# Patient Record
Sex: Male | Born: 1995 | Race: Black or African American | Hispanic: No | Marital: Single | Smoking: Current every day smoker
Health system: Southern US, Community
[De-identification: ages and names within clinical notes are randomized; demographics above are authoritative.]

---
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
## 2015-11-21 ENCOUNTER — Emergency Department (HOSPITAL_COMMUNITY): Payer: 59

## 2015-11-21 ENCOUNTER — Encounter (HOSPITAL_COMMUNITY): Payer: Self-pay | Admitting: Oncology

## 2015-11-21 ENCOUNTER — Emergency Department (HOSPITAL_COMMUNITY)
Admission: EM | Admit: 2015-11-21 | Discharge: 2015-11-21 | Disposition: A | Discharge status: Home / Self Care | Payer: 59 | Attending: Emergency Medicine | Admitting: Emergency Medicine

## 2015-11-21 DIAGNOSIS — S01511A Laceration without foreign body of lip, initial encounter: Secondary | ICD-10-CM | POA: Insufficient documentation

## 2015-11-21 DIAGNOSIS — Y9289 Other specified places as the place of occurrence of the external cause: Secondary | ICD-10-CM | POA: Insufficient documentation

## 2015-11-21 DIAGNOSIS — Y998 Other external cause status: Secondary | ICD-10-CM | POA: Diagnosis not present

## 2015-11-21 DIAGNOSIS — S50311A Abrasion of right elbow, initial encounter: Secondary | ICD-10-CM | POA: Diagnosis not present

## 2015-11-21 DIAGNOSIS — Y9389 Activity, other specified: Secondary | ICD-10-CM | POA: Diagnosis not present

## 2015-11-21 DIAGNOSIS — S50312A Abrasion of left elbow, initial encounter: Secondary | ICD-10-CM | POA: Diagnosis not present

## 2015-11-21 DIAGNOSIS — S0990XA Unspecified injury of head, initial encounter: Secondary | ICD-10-CM | POA: Insufficient documentation

## 2015-11-21 DIAGNOSIS — T148XXA Other injury of unspecified body region, initial encounter: Secondary | ICD-10-CM

## 2015-11-21 DIAGNOSIS — T148 Other injury of unspecified body region: Secondary | ICD-10-CM | POA: Insufficient documentation

## 2015-11-21 DIAGNOSIS — S0993XA Unspecified injury of face, initial encounter: Secondary | ICD-10-CM | POA: Diagnosis present

## 2015-11-21 MED ORDER — METHOCARBAMOL 500 MG PO TABS: 500.0000 mg | ORAL_TABLET | Freq: Two times a day (BID) | ORAL | Status: DC

## 2015-11-21 MED ORDER — METHOCARBAMOL 500 MG PO TABS
500.0000 mg | ORAL_TABLET | Freq: Once | ORAL | Status: AC
  Administered 2015-11-21: 500 mg via ORAL
  Filled 2015-11-21: qty 1

## 2015-11-21 MED ORDER — OXYCODONE-ACETAMINOPHEN 5-325 MG PO TABS
1.0000 | ORAL_TABLET | Freq: Once | ORAL | Status: AC
  Administered 2015-11-21: 1 via ORAL
  Filled 2015-11-21: qty 1

## 2015-11-21 MED ORDER — LIDOCAINE-EPINEPHRINE 2 %-1:100000 IJ SOLN
20.0000 mL | Freq: Once | INTRAMUSCULAR | Status: AC
  Administered 2015-11-21: 20 mL
  Filled 2015-11-21: qty 1

## 2015-11-21 MED ORDER — BACITRACIN ZINC 500 UNIT/GM EX OINT
TOPICAL_OINTMENT | CUTANEOUS | Status: AC
  Administered 2015-11-21: 05:00:00
  Filled 2015-11-21: qty 3.6

## 2015-11-21 MED ORDER — OXYCODONE-ACETAMINOPHEN 5-325 MG PO TABS: 1.0000 | ORAL_TABLET | Freq: Four times a day (QID) | ORAL | Status: DC | PRN

## 2015-11-21 NOTE — ED Notes (Signed)
Pt in radiology 
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
## 2015-11-21 NOTE — Discharge Instructions (Signed)
Laceration Care, Adult  A laceration is a cut that goes through all layers of the skin. The cut also goes into the tissue that is right under the skin. Some cuts heal on their own. Others need to be closed with stitches (sutures), staples, skin adhesive strips, or wound glue. Taking care of your cut lowers your risk of infection and helps your cut to heal better.  HOW TO TAKE CARE OF YOUR CUT  For stitches or staples:  · Keep the wound clean and dry.  · If you were given a bandage (dressing), you should change it at least one time per day or as told by your doctor. You should also change it if it gets wet or dirty.  · Keep the wound completely dry for the first 24 hours or as told by your doctor. After that time, you may take a shower or a bath. However, make sure that the wound is not soaked in water until after the stitches or staples have been removed.  · Clean the wound one time each day or as told by your doctor:    Wash the wound with soap and water.    Rinse the wound with water until all of the soap comes off.    Pat the wound dry with a clean towel. Do not rub the wound.  · After you clean the wound, put a thin layer of antibiotic ointment on it as told by your doctor. This ointment:    Helps to prevent infection.    Keeps the bandage from sticking to the wound.  · Have your stitches or staples removed as told by your doctor.  If your doctor used skin adhesive strips:   · Keep the wound clean and dry.  · If you were given a bandage, you should change it at least one time per day or as told by your doctor. You should also change it if it gets dirty or wet.  · Do not get the skin adhesive strips wet. You can take a shower or a bath, but be careful to keep the wound dry.  · If the wound gets wet, pat it dry with a clean towel. Do not rub the wound.  · Skin adhesive strips fall off on their own. You can trim the strips as the wound heals. Do not remove any strips that are still stuck to the wound. They will  fall off after a while.  If your doctor used wound glue:  · Try to keep your wound dry, but you may briefly wet it in the shower or bath. Do not soak the wound in water, such as by swimming.  · After you take a shower or a bath, gently pat the wound dry with a clean towel. Do not rub the wound.  · Do not do any activities that will make you really sweaty until the skin glue has fallen off on its own.  · Do not apply liquid, cream, or ointment medicine to your wound while the skin glue is still on.  · If you were given a bandage, you should change it at least one time per day or as told by your doctor. You should also change it if it gets dirty or wet.  · If a bandage is placed over the wound, do not let the tape for the bandage touch the skin glue.  · Do not pick at the glue. The skin glue usually stays on for 5-10 days. Then, it   falls off of the skin.  General Instructions   · To help prevent scarring, make sure to cover your wound with sunscreen whenever you are outside after stitches are removed, after adhesive strips are removed, or when wound glue stays in place and the wound is healed. Make sure to wear a sunscreen of at least 30 SPF.  · Take over-the-counter and prescription medicines only as told by your doctor.  · If you were given antibiotic medicine or ointment, take or apply it as told by your doctor. Do not stop using the antibiotic even if your wound is getting better.  · Do not scratch or pick at the wound.  · Keep all follow-up visits as told by your doctor. This is important.  · Check your wound every day for signs of infection. Watch for:    Redness, swelling, or pain.    Fluid, blood, or pus.  · Raise (elevate) the injured area above the level of your heart while you are sitting or lying down, if possible.  GET HELP IF:  · You got a tetanus shot and you have any of these problems at the injection site:    Swelling.    Very bad pain.    Redness.    Bleeding.  · You have a fever.  · A wound that was  closed breaks open.  · You notice a bad smell coming from your wound or your bandage.  · You notice something coming out of the wound, such as wood or glass.  · Medicine does not help your pain.  · You have more redness, swelling, or pain at the site of your wound.  · You have fluid, blood, or pus coming from your wound.  · You notice a change in the color of your skin near your wound.  · You need to change the bandage often because fluid, blood, or pus is coming from the wound.  · You start to have a new rash.  · You start to have numbness around the wound.  GET HELP RIGHT AWAY IF:  · You have very bad swelling around the wound.  · Your pain suddenly gets worse and is very bad.  · You notice painful lumps near the wound or on skin that is anywhere on your body.  · You have a red streak going away from your wound.  · The wound is on your hand or foot and you cannot move a finger or toe like you usually can.  · The wound is on your hand or foot and you notice that your fingers or toes look pale or bluish.     This information is not intended to replace advice given to you by your health care provider. Make sure you discuss any questions you have with your health care provider.     Document Released: 01/09/2008 Document Revised: 12/07/2014 Document Reviewed: 07/19/2014  [employer]vier Interactive Patient Education ©2016 [employer]
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
## 2021-04-24 ENCOUNTER — Encounter (HOSPITAL_COMMUNITY): Payer: Self-pay | Admitting: Emergency Medicine

## 2021-04-24 ENCOUNTER — Emergency Department (HOSPITAL_COMMUNITY): Payer: Self-pay

## 2021-04-24 ENCOUNTER — Other Ambulatory Visit: Payer: Self-pay

## 2021-04-24 ENCOUNTER — Emergency Department (HOSPITAL_COMMUNITY)
Admission: EM | Admit: 2021-04-24 | Discharge: 2021-04-24 | Disposition: A | Discharge status: Home / Self Care | Payer: Self-pay | Attending: Emergency Medicine | Admitting: Emergency Medicine

## 2021-04-24 DIAGNOSIS — W260XXA Contact with knife, initial encounter: Secondary | ICD-10-CM | POA: Insufficient documentation

## 2021-04-24 DIAGNOSIS — Y93G9 Activity, other involving cooking and grilling: Secondary | ICD-10-CM | POA: Insufficient documentation

## 2021-04-24 DIAGNOSIS — S61212A Laceration without foreign body of right middle finger without damage to nail, initial encounter: Secondary | ICD-10-CM

## 2021-04-24 DIAGNOSIS — Z23 Encounter for immunization: Secondary | ICD-10-CM | POA: Insufficient documentation

## 2021-04-24 MED ORDER — TETANUS-DIPHTH-ACELL PERTUSSIS 5-2.5-18.5 LF-MCG/0.5 IM SUSY
0.5000 mL | PREFILLED_SYRINGE | Freq: Once | INTRAMUSCULAR | Status: AC
  Administered 2021-04-24: 0.5 mL via INTRAMUSCULAR
  Filled 2021-04-24: qty 0.5

## 2021-04-24 MED ORDER — LIDOCAINE HCL (PF) 1 % IJ SOLN
10.0000 mL | Freq: Once | INTRAMUSCULAR | Status: AC
  Administered 2021-04-24: 10 mL
  Filled 2021-04-24: qty 30

## 2021-04-24 NOTE — ED Provider Notes (Signed)
[facility]-EMERGENCY DEPT Provider Note   CSN: [id number] Arrival date & time: 04/24/21  0524     History Chief Complaint  Patient presents with   Finger Injury    Laceration      Bryce Henderson is a 25 y.o. male who presents to the emergency department with right third finger laceration which occurred late last night.  He was cutting up some food when he accidentally slipped and cut the finger.  The area is sore, no alleviating or aggravating factors.  He denies numbness, tingling, weakness, or other areas of injury.  Unknown last tetanus.  HPI     History reviewed. No pertinent past medical history.  There are no problems to display for this patient.   History reviewed. No pertinent surgical history.     No family history on file.  Social History   Tobacco Use   Smoking status: Never   Smokeless tobacco: Never  Substance Use Topics   Alcohol use: Yes    Comment: Occassional   Drug use: Yes    Types: Marijuana    Comment: occassional    Home Medications Prior to Admission medications   Medication Sig Start Date End Date Taking? Authorizing Provider  aspirin-acetaminophen-caffeine (EXCEDRIN MIGRAINE) [phone] MG tablet Take 1 tablet by mouth every 6 (six) hours as needed for headache.    [provider]  methocarbamol (ROBAXIN) 500 MG tablet Take 1 tablet (500 mg total) by mouth 2 (two) times daily. 11/21/15   Palumbo, April, MD  oxyCODONE-acetaminophen (PERCOCET) 5-325 MG tablet Take 1 tablet by mouth every 6 (six) hours as needed. 11/21/15   Palumbo, April, MD    Allergies    Patient has no known allergies.  Review of Systems   Review of Systems  Constitutional:  Negative for chills, fatigue and fever.  Gastrointestinal:  Negative for vomiting.  Musculoskeletal:  Positive for myalgias.  Skin:  Positive for wound.  Neurological:  Negative for weakness and numbness.  All other systems reviewed and are negative.  Physical  Exam Updated Vital Signs BP 135/82 (BP Location: Left Arm)   Pulse 65   Temp 98.5 F (36.9 C) (Oral)   Resp 20   SpO2 100%   Physical Exam Vitals and nursing note reviewed.  Constitutional:      General: He is not in acute distress.    Appearance: Normal appearance. He is not ill-appearing or toxic-appearing.  HENT:     Head: Normocephalic and atraumatic.  Neck:     Comments: No midline tenderness.  Cardiovascular:     Rate and Rhythm: Normal rate.     Pulses:          Radial pulses are 2+ on the right side and 2+ on the left side.  Pulmonary:     Effort: No respiratory distress.     Breath sounds: Normal breath sounds.  Musculoskeletal:     Cervical back: Normal range of motion and neck supple.     Comments: Upper extremities: Patient has an irregularly shaped laceration to the palmar aspect of the right third digit to the middle and distal phalanx.  This approximately is 4 cm total in length and at deepest area is 3 mm deep in the center with gaping.  No active bleeding.  No visible foreign bodies.  Patient has also avulsed the skin of the tip of the right third digit with an avulsion of the nail.  No active bleeding or foreign bodies to this area.  No visible nailbed laceration.  Able to fully flex/extend all MCP/IP joints.  Mildly tender directly over wounds.  Otherwise nontender.  Skin:    General: Skin is warm and dry.     Capillary Refill: Capillary refill takes less than 2 seconds.  Neurological:     Mental Status: He is alert.     Comments: Alert. Clear speech. Sensation grossly intact to bilateral upper extremities. 5/5 symmetric grip strength. Ambulatory.  Able to perform okay sign, thumbs up, and cross second/third digits bilaterally.  Psychiatric:        Mood and Affect: Mood normal.        Behavior: Behavior normal.    ED Results / Procedures / Treatments   Labs (all labs ordered are listed, but only abnormal results are displayed) Labs Reviewed - No data to  display  EKG None  Radiology DG Finger Middle Right  Result Date: 04/24/2021 CLINICAL DATA:  Finger laceration EXAM: RIGHT MIDDLE FINGER 2+V COMPARISON:  None. FINDINGS: Irregularity of the distal middle finger soft tissues. No opaque foreign body or fracture. IMPRESSION: Soft tissue injury without fracture or opaque foreign body. Electronically Signed   By: Tiburcio Pea M.D.   On: 04/24/2021 06:32    Procedures .Marland KitchenLaceration Repair  Date/Time: 04/24/2021 7:23 AM Performed by: Cherly Anderson, PA-C Authorized by: Cherly Anderson, PA-C   Consent:    Consent obtained:  Verbal   Consent given by:  Patient   Risks, benefits, and alternatives were discussed: yes     Risks discussed:  Infection, need for additional repair, nerve damage, poor wound healing, poor cosmetic result and pain   Alternatives discussed:  No treatment Anesthesia:    Anesthesia method:  Nerve block   Block location:  Right 3rd finger   Block needle gauge:  27 G   Block anesthetic:  Lidocaine 1% w/o epi   Block injection procedure:  Anatomic landmarks palpated, introduced needle, negative aspiration for blood, incremental injection and anatomic landmarks identified   Block outcome:  Anesthesia achieved Laceration details:    Location:  Finger   Finger location:  R long finger   Length (cm):  4   Depth (mm):  3 Pre-procedure details:    Preparation:  Patient was prepped and draped in usual sterile fashion and imaging obtained to evaluate for foreign bodies Exploration:    Hemostasis achieved with:  Direct pressure   Imaging obtained: x-ray     Imaging outcome: foreign body not noted     Contaminated: no   Treatment:    Area cleansed with:  Povidone-iodine   Amount of cleaning:  Standard   Irrigation solution:  Sterile water   Irrigation method:  Pressure wash Skin repair:    Repair method:  Sutures   Suture size:  4-0   Suture material:  Nylon   Number of sutures:  6 Approximation:     Approximation:  Close Repair type:    Repair type:  Simple Post-procedure details:    Procedure completion:  Tolerated well, no immediate complications   Medications Ordered in ED Medications  lidocaine (PF) (XYLOCAINE) 1 % injection 10 mL (10 mLs Infiltration Given 04/24/21 0654)  Tdap (BOOSTRIX) injection 0.5 mL (0.5 mLs Intramuscular Given 04/24/21 6503)    ED Course  I have reviewed the triage vital signs and the nursing notes.  Pertinent labs & imaging results that were available during my care of the patient were reviewed by me and considered in my medical decision making (see  chart for details).    MDM Rules/Calculators/A&P                           Patient presents to the emergency department with laceration to right 3rd finger. Patient nontoxic appearing, resting comfortably. X-ray obtained in area of laceration, no fractures/dislocations or apparent radiopaque foreign bodies. Pressure irrigation performed. Wound explored and base of wound visualized in a bloodless field without evidence of foreign body.  Neurovascularly intact distally prior to procedure.  Laceration repair per procedure note above, tolerated well. Tetanus updated at today's visit.  Remains with good flexion/extension against resistance and good cap refill following procedure. Discussed suture home care as well as need for wound recheck and suture removal in 7 days.  I discussed results, treatment plan, need for follow-up, and return precautions with the patient including signs of infection. Provided opportunity for questions, patient confirmed understanding and is in agreement with plan.     Final Clinical Impression(s) / ED Diagnoses Final diagnoses:  Laceration of right middle finger without foreign body, nail damage status unspecified, initial encounter    Rx / DC Orders ED Discharge Orders     None        Cherly Anderson, PA-C 04/24/21 0727    Molpus, Jonny Ruiz, MD 04/24/21 1049

## 2021-04-24 NOTE — ED Triage Notes (Signed)
Patient presents after cutting his finger with a knife while cooking. Bleeding controlled at this time.

## 2021-04-24 NOTE — Discharge Instructions (Addendum)
You were seen in the emergency department today for a laceration. Your laceration was closed with 6 stitches. Please keep this area clean and dry for the next 24 hours, after 24 hours you may get this area wet, but avoid soaking the area. Keep the area covered as best possible especially when in the sun to help in minimizing scarring.   Your tetanus has been updated   You will need to have the stitches removed and the wound rechecked in 7 days. Please return to the emergency department, go to an urgent care, or see your primary care provider to have this performed. Return to the ER soon should you start to experience pus type drainage from the wound, redness around the wound, or fevers as this could indicate the area is infected, please return to the ER for any other worsening symptoms or concerns that you may have.

## 2023-03-14 ENCOUNTER — Emergency Department (HOSPITAL_COMMUNITY): Payer: Self-pay

## 2023-03-14 ENCOUNTER — Observation Stay (HOSPITAL_COMMUNITY)
Admission: EM | Admit: 2023-03-14 | Discharge: 2023-03-15 | Disposition: A | Discharge status: Home / Self Care | Payer: Self-pay | Attending: Internal Medicine | Admitting: Internal Medicine

## 2023-03-14 ENCOUNTER — Observation Stay (HOSPITAL_BASED_OUTPATIENT_CLINIC_OR_DEPARTMENT_OTHER): Payer: Self-pay | Admitting: Anesthesiology

## 2023-03-14 ENCOUNTER — Encounter (HOSPITAL_COMMUNITY): Payer: Self-pay | Admitting: Internal Medicine

## 2023-03-14 ENCOUNTER — Observation Stay (HOSPITAL_COMMUNITY): Payer: Self-pay | Admitting: Anesthesiology

## 2023-03-14 ENCOUNTER — Encounter (HOSPITAL_COMMUNITY)
Admission: EM | Disposition: A | Discharge status: Home / Self Care | Payer: Self-pay | Source: Home / Self Care | Attending: Emergency Medicine

## 2023-03-14 ENCOUNTER — Other Ambulatory Visit: Payer: Self-pay

## 2023-03-14 DIAGNOSIS — Y93K9 Activity, other involving animal care: Secondary | ICD-10-CM | POA: Insufficient documentation

## 2023-03-14 DIAGNOSIS — L03114 Cellulitis of left upper limb: Secondary | ICD-10-CM | POA: Diagnosis present

## 2023-03-14 DIAGNOSIS — M65939 Unspecified synovitis and tenosynovitis, unspecified forearm: Secondary | ICD-10-CM | POA: Diagnosis present

## 2023-03-14 DIAGNOSIS — W540XXA Bitten by dog, initial encounter: Secondary | ICD-10-CM

## 2023-03-14 DIAGNOSIS — M659 Synovitis and tenosynovitis, unspecified: Secondary | ICD-10-CM | POA: Diagnosis present

## 2023-03-14 DIAGNOSIS — S51852A Open bite of left forearm, initial encounter: Secondary | ICD-10-CM

## 2023-03-14 DIAGNOSIS — D649 Anemia, unspecified: Secondary | ICD-10-CM

## 2023-03-14 DIAGNOSIS — M65132 Other infective (teno)synovitis, left wrist: Principal | ICD-10-CM

## 2023-03-14 DIAGNOSIS — S51859A Open bite of unspecified forearm, initial encounter: Secondary | ICD-10-CM

## 2023-03-14 DIAGNOSIS — L089 Local infection of the skin and subcutaneous tissue, unspecified: Secondary | ICD-10-CM | POA: Diagnosis present

## 2023-03-14 DIAGNOSIS — Z23 Encounter for immunization: Secondary | ICD-10-CM | POA: Insufficient documentation

## 2023-03-14 HISTORY — PX: I & D EXTREMITY: SHX5045

## 2023-03-14 LAB — CBC WITH DIFFERENTIAL/PLATELET
Abs Immature Granulocytes: 0.03 10*3/uL (ref 0.00–0.07)
Basophils Absolute: 0 10*3/uL (ref 0.0–0.1)
Basophils Relative: 1 %
Eosinophils Absolute: 0.1 10*3/uL (ref 0.0–0.5)
Eosinophils Relative: 1 %
HCT: 35.9 % — ABNORMAL LOW (ref 39.0–52.0)
Hemoglobin: 12 g/dL — ABNORMAL LOW (ref 13.0–17.0)
Immature Granulocytes: 0 %
Lymphocytes Relative: 19 %
Lymphs Abs: 1.6 10*3/uL (ref 0.7–4.0)
MCH: 29.5 pg (ref 26.0–34.0)
MCHC: 33.4 g/dL (ref 30.0–36.0)
MCV: 88.2 fL (ref 80.0–100.0)
Monocytes Absolute: 0.9 10*3/uL (ref 0.1–1.0)
Monocytes Relative: 11 %
Neutro Abs: 5.7 10*3/uL (ref 1.7–7.7)
Neutrophils Relative %: 68 %
Platelets: 162 10*3/uL (ref 150–400)
RBC: 4.07 MIL/uL — ABNORMAL LOW (ref 4.22–5.81)
RDW: 12.6 % (ref 11.5–15.5)
WBC: 8.4 10*3/uL (ref 4.0–10.5)
nRBC: 0 % (ref 0.0–0.2)

## 2023-03-14 LAB — BASIC METABOLIC PANEL
Anion gap: 5 (ref 5–15)
BUN: 8 mg/dL (ref 6–20)
CO2: 28 mmol/L (ref 22–32)
Calcium: 8.8 mg/dL — ABNORMAL LOW (ref 8.9–10.3)
Chloride: 104 mmol/L (ref 98–111)
Creatinine, Ser: 1.15 mg/dL (ref 0.61–1.24)
GFR, Estimated: >60 mL/min (ref >60)
Glucose, Bld: 89 mg/dL (ref 70–99)
Potassium: 3.8 mmol/L (ref 3.5–5.1)
Sodium: 137 mmol/L (ref 135–145)

## 2023-03-14 LAB — AEROBIC/ANAEROBIC CULTURE W GRAM STAIN (SURGICAL/DEEP WOUND): Gram Stain: NONE SEEN

## 2023-03-14 LAB — I-STAT CG4 LACTIC ACID, ED: Lactic Acid, Venous: 0.5 mmol/L (ref 0.5–1.9)

## 2023-03-14 LAB — HIV ANTIBODY (ROUTINE TESTING W REFLEX): HIV Screen 4th Generation wRfx: NONREACTIVE

## 2023-03-14 SURGERY — IRRIGATION AND DEBRIDEMENT EXTREMITY
Anesthesia: General | Laterality: Left

## 2023-03-14 MED ORDER — 0.9 % SODIUM CHLORIDE (POUR BTL) OPTIME
TOPICAL | Status: DC | PRN
  Administered 2023-03-14: 1000 mL

## 2023-03-14 MED ORDER — FENTANYL CITRATE (PF) 100 MCG/2ML IJ SOLN
25.0000 ug | INTRAMUSCULAR | Status: DC | PRN
  Administered 2023-03-14: 50 ug via INTRAVENOUS

## 2023-03-14 MED ORDER — MIDAZOLAM HCL 2 MG/2ML IJ SOLN
INTRAMUSCULAR | Status: DC | PRN
  Administered 2023-03-14: 2 mg via INTRAVENOUS

## 2023-03-14 MED ORDER — ACETAMINOPHEN 650 MG RE SUPP: 650.0000 mg | Freq: Four times a day (QID) | RECTAL | Status: DC | PRN

## 2023-03-14 MED ORDER — LACTATED RINGERS IV SOLN: INTRAVENOUS | Status: DC

## 2023-03-14 MED ORDER — TETANUS-DIPHTH-ACELL PERTUSSIS 5-2.5-18.5 LF-MCG/0.5 IM SUSY
0.5000 mL | PREFILLED_SYRINGE | Freq: Once | INTRAMUSCULAR | Status: AC
  Administered 2023-03-14: 0.5 mL via INTRAMUSCULAR
  Filled 2023-03-14: qty 0.5

## 2023-03-14 MED ORDER — SODIUM CHLORIDE 0.9 % IV SOLN
3.0000 g | Freq: Once | INTRAVENOUS | Status: AC
  Administered 2023-03-14: 3 g via INTRAVENOUS
  Filled 2023-03-14: qty 8

## 2023-03-14 MED ORDER — CHLORHEXIDINE GLUCONATE 4 % EX SOLN: 60.0000 mL | Freq: Once | CUTANEOUS | Status: DC

## 2023-03-14 MED ORDER — CEFAZOLIN SODIUM-DEXTROSE 2-4 GM/100ML-% IV SOLN: 2.0000 g | INTRAVENOUS | Status: DC

## 2023-03-14 MED ORDER — GADOBUTROL 1 MMOL/ML IV SOLN
7.5000 mL | Freq: Once | INTRAVENOUS | Status: AC | PRN
  Administered 2023-03-14: 7.5 mL via INTRAVENOUS

## 2023-03-14 MED ORDER — ONDANSETRON HCL 4 MG/2ML IJ SOLN: 4.0000 mg | Freq: Four times a day (QID) | INTRAMUSCULAR | Status: DC | PRN

## 2023-03-14 MED ORDER — PHENYLEPHRINE 80 MCG/ML (10ML) SYRINGE FOR IV PUSH (FOR BLOOD PRESSURE SUPPORT)
PREFILLED_SYRINGE | INTRAVENOUS | Status: DC | PRN
  Administered 2023-03-14 (×2): 80 ug via INTRAVENOUS

## 2023-03-14 MED ORDER — VITAMIN C 500 MG PO TABS
1000.0000 mg | ORAL_TABLET | Freq: Every day | ORAL | Status: DC
  Administered 2023-03-15: 1000 mg via ORAL
  Filled 2023-03-14: qty 2

## 2023-03-14 MED ORDER — SODIUM CHLORIDE 0.9 % IV SOLN
3.0000 g | Freq: Four times a day (QID) | INTRAVENOUS | Status: DC
  Administered 2023-03-15 (×2): 3 g via INTRAVENOUS
  Filled 2023-03-14 (×3): qty 8

## 2023-03-14 MED ORDER — LACTATED RINGERS IV SOLN: INTRAVENOUS | Status: DC | PRN

## 2023-03-14 MED ORDER — MORPHINE SULFATE (PF) 2 MG/ML IV SOLN
2.0000 mg | INTRAVENOUS | Status: DC | PRN
  Administered 2023-03-14: 2 mg via INTRAVENOUS
  Filled 2023-03-14: qty 1

## 2023-03-14 MED ORDER — ALBUTEROL SULFATE (2.5 MG/3ML) 0.083% IN NEBU: 2.5000 mg | INHALATION_SOLUTION | Freq: Four times a day (QID) | RESPIRATORY_TRACT | Status: DC | PRN

## 2023-03-14 MED ORDER — PROPOFOL 10 MG/ML IV BOLUS
INTRAVENOUS | Status: AC
  Filled 2023-03-14: qty 20

## 2023-03-14 MED ORDER — FENTANYL CITRATE (PF) 250 MCG/5ML IJ SOLN
INTRAMUSCULAR | Status: AC
  Filled 2023-03-14: qty 5

## 2023-03-14 MED ORDER — FENTANYL CITRATE (PF) 250 MCG/5ML IJ SOLN
INTRAMUSCULAR | Status: DC | PRN
  Administered 2023-03-14: 50 ug via INTRAVENOUS
  Administered 2023-03-14: 25 ug via INTRAVENOUS
  Administered 2023-03-14: 50 ug via INTRAVENOUS
  Administered 2023-03-14: 25 ug via INTRAVENOUS

## 2023-03-14 MED ORDER — HYDRALAZINE HCL 20 MG/ML IJ SOLN: 10.0000 mg | INTRAMUSCULAR | Status: DC | PRN

## 2023-03-14 MED ORDER — ACETAMINOPHEN 325 MG PO TABS: 650.0000 mg | ORAL_TABLET | Freq: Four times a day (QID) | ORAL | Status: DC | PRN

## 2023-03-14 MED ORDER — SODIUM CHLORIDE 0.9 % IV SOLN
INTRAVENOUS | Status: DC | PRN
  Administered 2023-03-14: 3 g via INTRAVENOUS

## 2023-03-14 MED ORDER — ACETAMINOPHEN 500 MG PO TABS
1000.0000 mg | ORAL_TABLET | Freq: Once | ORAL | Status: AC
  Administered 2023-03-14: 1000 mg via ORAL
  Filled 2023-03-14: qty 2

## 2023-03-14 MED ORDER — CHLORHEXIDINE GLUCONATE 0.12 % MT SOLN
OROMUCOSAL | Status: AC
  Administered 2023-03-14: 15 mL via OROMUCOSAL
  Filled 2023-03-14: qty 15

## 2023-03-14 MED ORDER — HYDROCODONE-ACETAMINOPHEN 5-325 MG PO TABS
1.0000 | ORAL_TABLET | Freq: Four times a day (QID) | ORAL | Status: DC | PRN
  Administered 2023-03-15 (×2): 1 via ORAL
  Filled 2023-03-14 (×2): qty 1

## 2023-03-14 MED ORDER — SODIUM CHLORIDE 0.9 % IV SOLN: 3.0000 g | Freq: Three times a day (TID) | INTRAVENOUS | Status: DC

## 2023-03-14 MED ORDER — CHLORHEXIDINE GLUCONATE 0.12 % MT SOLN: 15.0000 mL | Freq: Once | OROMUCOSAL | Status: AC

## 2023-03-14 MED ORDER — IBUPROFEN 800 MG PO TABS: 800.0000 mg | ORAL_TABLET | Freq: Once | ORAL | Status: DC

## 2023-03-14 MED ORDER — SODIUM CHLORIDE 0.9 % IR SOLN
Status: DC | PRN
  Administered 2023-03-14: 3000 mL

## 2023-03-14 MED ORDER — CEFAZOLIN SODIUM-DEXTROSE 2-4 GM/100ML-% IV SOLN
INTRAVENOUS | Status: AC
  Filled 2023-03-14: qty 100

## 2023-03-14 MED ORDER — PROPOFOL 10 MG/ML IV BOLUS
INTRAVENOUS | Status: DC | PRN
  Administered 2023-03-14: 50 mg via INTRAVENOUS
  Administered 2023-03-14: 200 mg via INTRAVENOUS

## 2023-03-14 MED ORDER — FENTANYL CITRATE (PF) 100 MCG/2ML IJ SOLN
INTRAMUSCULAR | Status: AC
  Filled 2023-03-14: qty 2

## 2023-03-14 MED ORDER — ONDANSETRON HCL 4 MG PO TABS: 4.0000 mg | ORAL_TABLET | Freq: Four times a day (QID) | ORAL | Status: DC | PRN

## 2023-03-14 MED ORDER — SODIUM CHLORIDE 0.9% FLUSH
3.0000 mL | Freq: Two times a day (BID) | INTRAVENOUS | Status: DC
  Administered 2023-03-14 (×2): 3 mL via INTRAVENOUS

## 2023-03-14 MED ORDER — BUPIVACAINE HCL (PF) 0.25 % IJ SOLN
INTRAMUSCULAR | Status: AC
  Filled 2023-03-14: qty 30

## 2023-03-14 MED ORDER — POVIDONE-IODINE 10 % EX SWAB: 2.0000 | Freq: Once | CUTANEOUS | Status: DC

## 2023-03-14 MED ORDER — DEXAMETHASONE SODIUM PHOSPHATE 10 MG/ML IJ SOLN
INTRAMUSCULAR | Status: DC | PRN
  Administered 2023-03-14: 10 mg via INTRAVENOUS

## 2023-03-14 MED ORDER — ENOXAPARIN SODIUM 40 MG/0.4ML IJ SOSY
40.0000 mg | PREFILLED_SYRINGE | INTRAMUSCULAR | Status: DC
  Filled 2023-03-14: qty 0.4

## 2023-03-14 MED ORDER — AMOXICILLIN-POT CLAVULANATE 875-125 MG PO TABS
1.0000 | ORAL_TABLET | Freq: Once | ORAL | Status: AC
  Administered 2023-03-14: 1 via ORAL
  Filled 2023-03-14: qty 1

## 2023-03-14 MED ORDER — MIDAZOLAM HCL 2 MG/2ML IJ SOLN
INTRAMUSCULAR | Status: AC
  Filled 2023-03-14: qty 2

## 2023-03-14 MED ORDER — ONDANSETRON HCL 4 MG/2ML IJ SOLN
INTRAMUSCULAR | Status: DC | PRN
  Administered 2023-03-14: 4 mg via INTRAVENOUS

## 2023-03-14 MED ORDER — BUPIVACAINE HCL (PF) 0.25 % IJ SOLN
INTRAMUSCULAR | Status: DC | PRN
  Administered 2023-03-14: 10 mL

## 2023-03-14 MED ORDER — ORAL CARE MOUTH RINSE: 15.0000 mL | Freq: Once | OROMUCOSAL | Status: AC

## 2023-03-14 MED ORDER — LIDOCAINE 2% (20 MG/ML) 5 ML SYRINGE
INTRAMUSCULAR | Status: DC | PRN
  Administered 2023-03-14: 40 mg via INTRAVENOUS

## 2023-03-14 SURGICAL SUPPLY — 40 items
BAG COUNTER SPONGE SURGICOUNT (BAG) ×2 IMPLANT
BAG SPNG CNTER NS LX DISP (BAG) ×1
BNDG CMPR 5X3 KNIT ELC UNQ LF (GAUZE/BANDAGES/DRESSINGS) ×1
BNDG CMPR 9X4 STRL LF SNTH (GAUZE/BANDAGES/DRESSINGS) ×1
BNDG ELASTIC 3INX 5YD STR LF (GAUZE/BANDAGES/DRESSINGS) IMPLANT
BNDG ESMARK 4X9 LF (GAUZE/BANDAGES/DRESSINGS) IMPLANT
BNDG GAUZE DERMACEA FLUFF 4 (GAUZE/BANDAGES/DRESSINGS) IMPLANT
BNDG GZE DERMACEA 4 6PLY (GAUZE/BANDAGES/DRESSINGS) ×1
CORD BIPOLAR FORCEPS 12FT (ELECTRODE) ×2 IMPLANT
COVER SURGICAL LIGHT HANDLE (MISCELLANEOUS) ×2 IMPLANT
CUFF TOURN SGL QUICK 18X4 (TOURNIQUET CUFF) IMPLANT
GAUZE PACKING IODOFORM 1/4X15 (PACKING) IMPLANT
GAUZE SPONGE 4X4 12PLY STRL (GAUZE/BANDAGES/DRESSINGS) IMPLANT
GLOVE BIO SURGEON STRL SZ7.5 (GLOVE) ×2 IMPLANT
GLOVE BIOGEL PI IND STRL 8 (GLOVE) ×2 IMPLANT
GOWN STRL REUS W/ TWL LRG LVL3 (GOWN DISPOSABLE) ×2 IMPLANT
GOWN STRL REUS W/ TWL XL LVL3 (GOWN DISPOSABLE) ×2 IMPLANT
GOWN STRL REUS W/TWL LRG LVL3 (GOWN DISPOSABLE) ×1
GOWN STRL REUS W/TWL XL LVL3 (GOWN DISPOSABLE) ×1
KIT BASIN OR (CUSTOM PROCEDURE TRAY) ×2 IMPLANT
KIT TURNOVER KIT B (KITS) ×2 IMPLANT
MANIFOLD NEPTUNE II (INSTRUMENTS) IMPLANT
NDL HYPO 25GX1X1/2 BEV (NEEDLE) IMPLANT
NEEDLE HYPO 25GX1X1/2 BEV (NEEDLE) ×1 IMPLANT
NS IRRIG 1000ML POUR BTL (IV SOLUTION) ×2 IMPLANT
PACK ORTHO EXTREMITY (CUSTOM PROCEDURE TRAY) ×2 IMPLANT
PAD ARMBOARD 7.5X6 YLW CONV (MISCELLANEOUS) ×4 IMPLANT
PAD CAST 3X4 CTTN HI CHSV (CAST SUPPLIES) IMPLANT
PADDING CAST COTTON 3X4 STRL (CAST SUPPLIES) ×1
SET CYSTO W/LG BORE CLAMP LF (SET/KITS/TRAYS/PACK) IMPLANT
SOL PREP POV-IOD 4OZ 10% (MISCELLANEOUS) ×4 IMPLANT
SPLINT PLASTER CAST FAST 3X15 (CAST SUPPLIES) IMPLANT
SWAB COLLECTION DEVICE MRSA (MISCELLANEOUS) IMPLANT
SWAB CULTURE ESWAB REG 1ML (MISCELLANEOUS) IMPLANT
SYR 20ML LL LF (SYRINGE) ×2 IMPLANT
SYR CONTROL 10ML LL (SYRINGE) IMPLANT
TOWEL GREEN STERILE (TOWEL DISPOSABLE) ×2 IMPLANT
TUBE CONNECTING 12X1/4 (SUCTIONS) ×2 IMPLANT
UNDERPAD 30X36 HEAVY ABSORB (UNDERPADS AND DIAPERS) ×2 IMPLANT
YANKAUER SUCT BULB TIP NO VENT (SUCTIONS) ×2 IMPLANT

## 2023-03-14 NOTE — Progress Notes (Signed)
Post op note: S/p I&D left forearm dog bite wounds.  Purulence in each wound.  Wounds packed.  Continue antibiotics.  Start hydrotherapy in 2-4 days.  Okay for d/c home when WBC normalizing, afebrile, pain controlled.  Can start hydrotherapy in office next week.

## 2023-03-14 NOTE — ED Notes (Signed)
Patient transported to MRI 

## 2023-03-14 NOTE — Op Note (Signed)
NAME: Bryce Henderson MEDICAL RECORD NO: [id number] DATE OF BIRTH: [birth year] FACILITY: [facility] LOCATION: [facility] OR PHYSICIAN: Tami Ribas, MD   OPERATIVE REPORT   DATE OF PROCEDURE: 03/14/23    PREOPERATIVE DIAGNOSIS: Left forearm infected dog bite wounds x 4   POSTOPERATIVE DIAGNOSIS: Left forearm infected dog bite wounds x 4   PROCEDURE: Incision and drainage left forearm infected dog bite wounds x 4   SURGEON:  Betha Loa, M.D.   ASSISTANT: none   ANESTHESIA:  General   INTRAVENOUS FLUIDS:  Per anesthesia flow sheet.   ESTIMATED BLOOD LOSS:  Minimal.   COMPLICATIONS:  None.   SPECIMENS: Cultures to micro   TOURNIQUET TIME:    Total Tourniquet Time Documented: Upper Arm (Left) - 40 minutes Total: Upper Arm (Left) - 40 minutes    DISPOSITION:  Stable to PACU.   INDICATIONS: 27 year old male states he was bitten on the left forearm by his dog 3 days ago.  Has had progressively worsening swelling and pain in the forearm since.  He presented to the emergency department.  I recommended incision and drainage in the operating room.  Risks, benefits and alternatives of surgery were discussed including the risks of blood loss, infection, damage to nerves, vessels, tendons, ligaments, bone for surgery, need for additional surgery, complications with wound healing, continued pain, stiffness, , need for repeat irrigation and debridement.  He voiced understanding of these risks and elected to proceed.  OPERATIVE COURSE:  After being identified preoperatively by myself,  the patient and I agreed on the procedure and site of the procedure.  The surgical site was marked.  Surgical consent had been signed. Antibiotics were held for intraoperative cultures. He was transferred to the operating room and placed on the operating table in supine position with the Left upper extremity on an arm board.  General anesthesia was induced by the anesthesiologist.  Left upper extremity was prepped and  draped in normal sterile orthopedic fashion.  A surgical pause was performed between the surgeons, anesthesia, and operating room staff and all were in agreement as to the patient, procedure, and site of procedure.  Tourniquet at the proximal aspect of the extremity was inflated to 250 mmHg after exsanguination of the arm with an Esmarch bandage.  The wounds were open.  There is gross purulence in all 4 wounds.  Cultures were taken for aerobes and anaerobes.  There were 2 wounds at the volar radial aspect of the distal forearm.  These were both extended proximally and distally.  The knife was used to debride the skin and subcutaneous tissues to remove devitalized tissues.  The wounds went deep to the fascia.  The distal wound went down into the periosteum.  The proximal wound went down to muscle.  On the dorsum of the forearm the more proximal wound was smaller.  This was extended radially and ulnarly.  The wound went down into the muscle.  There was small bout of gross purulence.  The distal dorsal wound was the largest.  There was significant devitalized tissue.  There was purulence.  The wound went down into the second and third dorsal compartments.  There is a small hole in the fourth dorsal compartment.  He should these compartments was opened.  There is no gross purulence within the compartments.  There was synovial type fluid.  The wound was sharply debrided including skin subcutaneous tissues and fascia using the scissors and knife.  The rondure was used to remove infected and devitalized  tissues.  Hemostasis was obtained in all wounds using the bipolar electrocautery.  All wounds were copiously irrigated with sterile saline by cystoscopy tubing.  The wounds were then packed with quarter and try to form gauze.  They were injected with core percent plain Marcaine to aid in postoperative analgesia.  They were dressed with sterile 4 x 4's and wrapped with a Kerlix bandage.  Volar splint was placed and wrapped  with Kerlix and Ace bandage.  The tourniquet was deflated at 40 minutes.  Fingertips were pink with brisk capillary refill after deflation of tourniquet.  The operative  drapes were broken down.  The patient was awoken from anesthesia safely.  He was transferred back to the stretcher and taken to PACU in stable condition.  He is admitted to the hospitalist for IV antibiotics.  Will start hydrotherapy in 2 to 4 days.   Betha Loa, MD Electronically signed, 03/14/23

## 2023-03-14 NOTE — Transfer of Care (Signed)
Immediate Anesthesia Transfer of Care Note  Patient: Bryce Henderson  Procedure(s) Performed: IRRIGATION AND DEBRIDEMENT LEFT UPPER ARM (Left)  Patient Location: PACU  Anesthesia Type:General  Level of Consciousness: awake and alert   Airway & Oxygen Therapy: Patient Spontanous Breathing  Post-op Assessment: Report given to RN and Post -op Vital signs reviewed and stable  Post vital signs: Reviewed and stable  Last Vitals:  Vitals Value Taken Time  BP 134/89 03/14/23 2046  Temp 36.4 C 03/14/23 2045  Pulse 58 03/14/23 2048  Resp 18 03/14/23 2048  SpO2 100 % 03/14/23 2048  Vitals shown include unfiled device data.  Last Pain:  Vitals:   03/14/23 1824  TempSrc: Oral  PainSc:          Complications: No notable events documented.

## 2023-03-14 NOTE — Anesthesia Preprocedure Evaluation (Addendum)
Anesthesia Evaluation  Patient identified by MRN, date of birth, ID band Patient awake    Reviewed: Allergy & Precautions, NPO status , Patient's Chart, lab work & pertinent test results  History of Anesthesia Complications Negative for: history of anesthetic complications  Airway Mallampati: II  TM Distance: >3 FB Neck ROM: Full    Dental  (+) Teeth Intact, Dental Advisory Given   Pulmonary Current Smoker and Patient abstained from smoking.   breath sounds clear to auscultation       Cardiovascular negative cardio ROS  Rhythm:Regular     Neuro/Psych negative neurological ROS  negative psych ROS   GI/Hepatic negative GI ROS,,,(+)     substance abuse  marijuana use  Endo/Other  negative endocrine ROS    Renal/GU negative Renal ROS     Musculoskeletal negative musculoskeletal ROS (+)    Abdominal   Peds  Hematology negative hematology ROS (+)   Anesthesia Other Findings Dog bite  Reproductive/Obstetrics                             Anesthesia Physical Anesthesia Plan  ASA: 2  Anesthesia Plan: General   Post-op Pain Management: Tylenol PO (pre-op)*   Induction: Intravenous  PONV Risk Score and Plan: 1 and Ondansetron, Dexamethasone and Midazolam  Airway Management Planned: LMA  Additional Equipment: None  Intra-op Plan:   Post-operative Plan: Extubation in OR  Informed Consent: I have reviewed the patients History and Physical, chart, labs and discussed the procedure including the risks, benefits and alternatives for the proposed anesthesia with the patient or authorized representative who has indicated his/her understanding and acceptance.     Dental advisory given  Plan Discussed with: CRNA  Anesthesia Plan Comments:        Anesthesia Quick Evaluation

## 2023-03-14 NOTE — ED Triage Notes (Addendum)
Pt. Stated, I was doing house work and cut my left wrist on top. Pt. Has multiple[ cuts to the left wrist area and did not want to explain anything about it. Left hand and wrist swollen and warm to the touch. This took place on Monday

## 2023-03-14 NOTE — Discharge Instructions (Signed)

## 2023-03-14 NOTE — Anesthesia Procedure Notes (Addendum)
Procedure Name: LMA Insertion Date/Time: 03/14/2023 7:17 PM  Performed by: Edmonia Caprio, CRNAPre-anesthesia Checklist: Patient identified, Emergency Drugs available, Suction available, Timeout performed and Patient being monitored Patient Re-evaluated:Patient Re-evaluated prior to induction Oxygen Delivery Method: Circle system utilized Preoxygenation: Pre-oxygenation with 100% oxygen Induction Type: IV induction Ventilation: Mask ventilation without difficulty LMA: LMA inserted LMA Size: 5.0 Tube type: Oral Number of attempts: 2 Placement Confirmation: positive ETCO2 and breath sounds checked- equal and bilateral Tube secured with: Tape Dental Injury: Teeth and Oropharynx as per pre-operative assessment

## 2023-03-14 NOTE — H&P (Signed)
History and Physical    Patient: Bryce Henderson WUJ:[id number] DOB: 1995-12-06 DOA: 03/14/2023 DOS: the patient was seen and examined on 03/14/2023 PCP: Maryellen Pile, MD (Inactive)  Patient coming from: Home  Chief Complaint:  Chief Complaint  Patient presents with   Laceration   LEFT WRIST PAIN   HPI: CARLY Henderson is a 27 y.o. male without significant medical history who presents with complaints of swollen left wrist.  He had gotten bit by his dog on his wrist 3 days ago.  He reports his dog is up-to-date on all of his vaccinations.  At home patient had cleaned the wound with hydrogen peroxide and had been putting Neosporin on it.  Despite doing so reported increasing pain and swelling in the affected area.  He had been taking Tylenol for pain.  He denied having any fever, chills, cough, shortness of breath, nausea, vomiting, or abdominal pain.  He had gone to [facility] urgent care today due to due to his symptoms that was found to have a fever up to 101 F and having given 800 mg of ibuprofen prior to being sent to the hospital for further evaluation.  In the emergency department patient was noted to be afebrile with stable vital signs.  Labs relatively unremarkable with white blood cell count 8.4, lactic acid 0.5, and hemoglobin 12.  X-rays of the left wrist noted soft tissue swelling with subcutaneous emphysema along the dorsal and radial aspect of the wrist and distal forearm.  MRI left hand and forearm significant for severe soft tissue swelling of the wrist and hand consistent with cellulitis and mild second and third extensor compartment tenosynovitis.  Dr. Merlyn Lot pain and surgery have been consulted and plan to take the patient for I&D.  Patient had been given Augmentin 1 tablet p.o. and a tetanus booster prior to being started on Unasyn.   Review of Systems: As mentioned in the history of present illness. All other systems reviewed and are negative.  No past medical history on file. No  past surgical history on file. Social History:  reports that he has been smoking cigarettes. He has never used smokeless tobacco. He reports current alcohol use. He reports current drug use. Drug: Marijuana.  No Known Allergies  No family history on file.  Prior to Admission medications   Medication Sig Start Date End Date Taking? Authorizing Provider  acetaminophen (TYLENOL) 500 MG tablet Take 1,000 mg by mouth 2 (two) times daily as needed for moderate pain, fever or headache.   Yes [provider]  ibuprofen (ADVIL) 200 MG tablet Take 800 mg by mouth 2 (two) times daily as needed for headache or moderate pain.   Yes [provider]    Physical Exam: Vitals:   03/14/23 0826 03/14/23 0830 03/14/23 0900 03/14/23 1153  BP: 124/73 130/83 117/81 114/73  Pulse: 82 65 69 (!) 56  Resp: 18   18  Temp: 98.3 F (36.8 C)   97.9 F (36.6 C)  TempSrc: Oral   Oral  SpO2: 99% 98% 99% 100%  Weight: 75.8 kg     Height: 6' (1.829 m)       Constitutional: Young male currently no acute distress Eyes: PERRL, lids and conjunctivae normal ENMT: Mucous membranes are moist. Posterior pharynx clear of any exudate or lesions.Normal dentition.  Neck: normal, supple, no masses, no thyromegaly Respiratory: clear to auscultation bilaterally, no wheezing, no crackles. Normal respiratory effort. No accessory muscle use.  Cardiovascular: Regular rate and rhythm, no  murmurs / rubs / gallops. No extremity edema. 2+ pedal pulses. No carotid bruits.  Abdomen: no tenderness, no masses palpated. No hepatosplenomegaly. Bowel sounds positive.  Musculoskeletal: no clubbing / cyanosis. No joint deformity upper and lower extremities. Good ROM, no contractures.  Skin: Swelling of the left wrist with lacerations and bite marks as seen below    Neurologic: CN 2-12 grossly intact.   Strength 5/5 in all 4.  Psychiatric: Normal judgment and insight. Alert and oriented x 3. Normal mood.   Data  Reviewed:  Reviewed labs, imaging, and pertinent records note above.  Assessment and Plan: Infected dog bite of forearm Cellulitis and tenosynovitis of left forearm Acute.  Patient reports getting bitten on his left wrist by his dog 3 days ago.  Seen at urgent care to have fever up to 101 F but no other SIRS criteria met MRI of the left wrist and forearm significant for cellulitis with concern for mild tenosynovitis.  Dr. Merlyn Lot of hand surgery have been consulted and plan to take the patient for I&D.  Patient had been given Augmentin and subsequently switched to Unasyn IV. -Admit to a MedSurg bed -N.p.o. for I&D.  Orders placed to advance diet following procedure -Continue Unasyn IV -Hydrocodone/morphine IV as needed for moderate-severe pain -Appreciate hand surgery consultative services we will follow-up for any further recommendation  Normocytic anemia Acute.  Hemoglobin 12.  No reports of bleeding.  No prior labs to compare. -Recommend patient follow-up in the outpatient setting  DVT prophylaxis: Lovenox Advance Care Planning:   Code Status: Full Code    Consults: Hand surgery  Family Communication: Significant other updated at bedside  Severity of Illness: The appropriate patient status for this patient is OBSERVATION. Observation status is judged to be reasonable and necessary in order to provide the required intensity of service to ensure the patient's safety. The patient's presenting symptoms, physical exam findings, and initial radiographic and laboratory data in the context of their medical condition is felt to place them at decreased risk for further clinical deterioration. Furthermore, it is anticipated that the patient will be medically stable for discharge from the hospital within 2 midnights of admission.   Author: Clydie Braun, MD 03/14/2023 1:39 PM  For on call review [email or web address].

## 2023-03-14 NOTE — Consult Note (Addendum)
Reason for Consult:Left arm infection Referring Physician: Alvester Chou Time called: [id number] Time at bedside: 0927   Bryce Henderson is an 27 y.o. male.  HPI: Bryce Henderson was bitten by his dog on Monday. Dog is up to date on shots. He has been treating this at home with H2O2 and Neosporin. He notes that it hasn't really gotten better or worse to date. He came to the ED for evaluation and hand surgery was consulted. He is R[employer] and works as a [employer].  No past medical history on file.  No past surgical history on file.  No family history on file.  Social History:  reports that he has never smoked. He has never used smokeless tobacco. He reports current alcohol use. He reports current drug use. Drug: Marijuana.  Allergies: No Known Allergies  Medications: I have reviewed the patient's current medications.  Results for orders placed or performed during the hospital encounter of 03/14/23 (from the past 48 hour(s))  I-Stat CG4 Lactic Acid     Status: None   Collection Time: 03/14/23  9:34 AM  Result Value Ref Range   Lactic Acid, Venous 0.5 0.5 - 1.9 mmol/L    DG Wrist Complete Left  Result Date: 03/14/2023 CLINICAL DATA:  Laceration to the left wrist EXAM: LEFT WRIST - COMPLETE 4 VIEW COMPARISON:  Left wrist radiographs dated 11/21/2015 FINDINGS: There is no evidence of fracture or dislocation. There is no evidence of arthropathy or other focal bone abnormality. Soft tissue swelling with foci of subcutaneous emphysema along the dorsal and radial aspect of the wrist and distal forearm. No radiopaque foreign body. IMPRESSION: 1. Soft tissue swelling with foci of subcutaneous emphysema along the dorsal and radial aspect of the wrist and distal forearm. No radiopaque foreign body. 2. No acute fracture or dislocation. Electronically Signed   By: Agustin Cree M.D.   On: 03/14/2023 08:50    Review of Systems  Constitutional:  Negative for chills, diaphoresis and fever.  HENT:  Negative for ear  discharge, ear pain, hearing loss and tinnitus.   Eyes:  Negative for photophobia and pain.  Respiratory:  Negative for cough and shortness of breath.   Cardiovascular:  Negative for chest pain.  Gastrointestinal:  Negative for abdominal pain, nausea and vomiting.  Genitourinary:  Negative for dysuria, flank pain, frequency and urgency.  Musculoskeletal:  Positive for arthralgias (Left FA/hand). Negative for back pain, myalgias and neck pain.  Neurological:  Negative for dizziness and headaches.  Hematological:  Does not bruise/bleed easily.  Psychiatric/Behavioral:  The patient is not nervous/anxious.    Blood pressure 124/73, pulse 82, temperature 98.3 F (36.8 C), temperature source Oral, resp. rate 18, height 6' (1.829 m), weight 75.8 kg, SpO2 99%. Physical Exam Constitutional:      General: He is not in acute distress.    Appearance: He is well-developed. He is not diaphoretic.  HENT:     Head: Normocephalic and atraumatic.  Eyes:     General: No scleral icterus.       Right eye: No discharge.        Left eye: No discharge.     Conjunctiva/sclera: Conjunctivae normal.  Cardiovascular:     Rate and Rhythm: Normal rate and regular rhythm.  Pulmonary:     Effort: Pulmonary effort is normal. No respiratory distress.  Musculoskeletal:     Cervical back: Normal range of motion.     Comments: Left shoulder, elbow, wrist, digits- Laceration to radial distal FA, transverse around 3cm  with some purulent elements noted in wound bed, some puncture wounds volar FA, hand and distal FA with edema but little erythema, mod TTP but able to AROM wrist and he will let me extend fingers though notes it hurts, no instability, no blocks to motion  Sens  Ax/R/M/U intact  Mot   Ax/ R/ PIN/ M/ AIN/ U intact  Rad 2+  Skin:    General: Skin is warm and dry.  Neurological:     Mental Status: He is alert.  Psychiatric:        Mood and Affect: Mood normal.        Behavior: Behavior normal.     Assessment/Plan: Left arm infection -- Plan I&D this afternoon by Dr. Merlyn Lot. Request medical admit for IV abx administration.    Bryce Caldron, PA-C Orthopedic Surgery [phone] 03/14/2023, 9:37 AM   Addendum (03/14/2023): Patient seen and examined.  Agree with above. History: 27 year old male states he was bitten by his dog on his left arm 3 days ago.  Has had progressively worsening swelling and pain since that day.  No subjective fevers.  He feels his hand has improved a little bit since antibiotics this morning. Exam: Intact sensation and cap refill all fingertips.  He has a wound on the dorsum of the wrist over the fourth dorsal compartment with active drainage.  Multiple smaller wounds on the volar radial aspect of the distal forearm.  Small amount of erythema surrounding knees.  He is tender to palpation around the wounds.  He is able to flex and extend all digits.  Swelling of the dorsum of the hand with tenderness to palpation.  No erythema no warmth. Assessment/plan: Left forearm/wrist infected dog bite wounds.  Recommend incision and drainage in the operating room.  Risks, benefits and alternatives of surgery were discussed including risks of blood loss, infection, damage to nerves/vessels/tendons/ligament/bone, failure of surgery, need for additional surgery, complication with wound healing, stiffness, need for repeat irrigation and debridement.  He voiced understanding of these risks and elected to proceed.

## 2023-03-14 NOTE — ED Notes (Signed)
Pt returned from MRI °

## 2023-03-14 NOTE — ED Notes (Signed)
MD at bedside. 

## 2023-03-14 NOTE — ED Notes (Signed)
ED TO INPATIENT HANDOFF REPORT  ED Nurse Name and Phone #: Lenell Antu Name/Age/Gender Bryce Henderson 27 y.o. male Room/Bed: 043C/043C  Code Status   Code Status: Full Code  Home/SNF/Other Home Patient oriented to: self, place, time, and situation Is this baseline? Yes   Triage Complete: Triage complete  Chief Complaint Infected dog bite of forearm, initial encounter [S51.859A, L08.9, W54.0XXA]  Triage Note Pt. Stated, I was doing house work and cut my left wrist on top. Pt. Has multiple[ cuts to the left wrist area and did not want to explain anything about it. Left hand and wrist swollen and warm to the touch. This took place on Monday   Allergies No Known Allergies  Level of Care/Admitting Diagnosis ED Disposition     ED Disposition  Admit   Condition  --   Comment  Hospital Area: [facility] [[id number]]  Level of Care: Med-Surg [16]  May place patient in observation at [facility] or [facility] if equivalent level of care is available:: No  Covid Evaluation: Asymptomatic - no recent exposure (last 10 days) testing not required  Diagnosis: Infected dog bite of forearm, initial encounter [[id number]]  Admitting Physician: Clydie Braun [[id number]]  Attending Physician: Clydie Braun [[id number]]          B Medical/Surgery History No past medical history on file. No past surgical history on file.   A IV Location/Drains/Wounds Patient Lines/Drains/Airways Status     Active Line/Drains/Airways     Name Placement date Placement time Site Days   Peripheral IV 03/14/23 20 G Right Antecubital 03/14/23  1315  Antecubital  less than 1            Intake/Output Last 24 hours No intake or output data in the 24 hours ending 03/14/23 1411  Labs/Imaging Results for orders placed or performed during the hospital encounter of 03/14/23 (from the past 48 hour(s))  CBC with Differential     Status: Abnormal   Collection Time: 03/14/23  9:29 AM  Result  Value Ref Range   WBC 8.4 4.0 - 10.5 K/uL   RBC 4.07 (L) 4.22 - 5.81 MIL/uL   Hemoglobin 12.0 (L) 13.0 - 17.0 g/dL   HCT 38.7 (L) 56.4 - 33.2 %   MCV 88.2 80.0 - 100.0 fL   MCH 29.5 26.0 - 34.0 pg   MCHC 33.4 30.0 - 36.0 g/dL   RDW 95.1 88.4 - 16.6 %   Platelets 162 150 - 400 K/uL   nRBC 0.0 0.0 - 0.2 %   Neutrophils Relative % 68 %   Neutro Abs 5.7 1.7 - 7.7 K/uL   Lymphocytes Relative 19 %   Lymphs Abs 1.6 0.7 - 4.0 K/uL   Monocytes Relative 11 %   Monocytes Absolute 0.9 0.1 - 1.0 K/uL   Eosinophils Relative 1 %   Eosinophils Absolute 0.1 0.0 - 0.5 K/uL   Basophils Relative 1 %   Basophils Absolute 0.0 0.0 - 0.1 K/uL   Immature Granulocytes 0 %   Abs Immature Granulocytes 0.03 0.00 - 0.07 K/uL    Comment: Performed at [facility], [address], [place], [place] [address]  Basic metabolic panel     Status: Abnormal   Collection Time: 03/14/23  9:29 AM  Result Value Ref Range   Sodium 137 135 - 145 mmol/L   Potassium 3.8 3.5 - 5.1 mmol/L   Chloride 104 98 - 111 mmol/L   CO2 28 22 -  32 mmol/L   Glucose, Bld 89 70 - 99 mg/dL    Comment: Glucose reference range applies only to samples taken after fasting for at least 8 hours.   BUN 8 6 - 20 mg/dL   Creatinine, Ser 4.54 0.61 - 1.24 mg/dL   Calcium 8.8 (L) 8.9 - 10.3 mg/dL   GFR, Estimated >09 >81 mL/min    Comment: (NOTE) Calculated using the CKD-EPI Creatinine Equation (2021)    Anion gap 5 5 - 15    Comment: Performed at [facility], [address], [place], [place] [address]  I-Stat CG4 Lactic Acid     Status: None   Collection Time: 03/14/23  9:34 AM  Result Value Ref Range   Lactic Acid, Venous 0.5 0.5 - 1.9 mmol/L   MR FOREARM LEFT W WO CONTRAST  Result Date: 03/14/2023 CLINICAL DATA:  Bitten by his dog on Monday. EXAM: MRI OF THE LEFT FOREARM WITHOUT AND WITH CONTRAST TECHNIQUE: Multiplanar, multisequence MR imaging of the left forearm was performed before and after the administration of intravenous  contrast. CONTRAST:  7.66mL GADAVIST GADOBUTROL 1 MMOL/ML IV SOLN COMPARISON:  None Available. FINDINGS: Bones/Joint/Cartilage No marrow signal abnormality. No fracture or dislocation. Joint spaces are preserved. No joint effusion. Muscles and Tendons Flexor and extensor tendons are intact. Small amount of fluid in the second and third extensor compartment tendon sheaths with associated synovial enhancement. No muscle edema or atrophy. Soft tissue Severe diffuse soft tissue swelling with enhancement. No discrete fluid collection. Soft tissue irregularity along the dorsal and radial aspects of the distal radius, presumably puncture wounds from dog bite. Mild inflammatory change in the pronator fat pad. No soft tissue mass. IMPRESSION: 1. Severe cellulitis of the forearm. No abscess or osteomyelitis. 2. Mild second and third extensor compartment tenosynovitis. 3. Soft tissue irregularity along the dorsal and radial aspects of the distal radius, presumably puncture wounds from dog bite. Electronically Signed   By: Obie Dredge M.D.   On: 03/14/2023 13:01   MR HAND LEFT W WO CONTRAST  Result Date: 03/14/2023 CLINICAL DATA:  Dog bite on Monday. EXAM: MRI OF THE LEFT HAND WITHOUT AND WITH CONTRAST TECHNIQUE: Multiplanar, multisequence MR imaging of the left hand was performed before and after the administration of intravenous contrast. CONTRAST:  7.74mL GADAVIST GADOBUTROL 1 MMOL/ML IV SOLN COMPARISON:  Left wrist x-rays from same day. FINDINGS: Bones/Joint/Cartilage No marrow signal abnormality. No fracture or dislocation. Joint spaces are preserved. No joint effusion. Ligaments Collateral ligaments are intact. Muscles and Tendons Flexor and extensor tendons are intact. Small amount of fluid in the second and third extensor tendon compartments with associated synovial enhancement. Soft tissue Severe soft tissue swelling of the wrist and hand with patchy enhancement. No fluid collection. Soft tissue irregularity at  the level of the dorsal distal radius, presumably site of dog bite. No soft tissue mass. IMPRESSION: 1. Severe soft tissue swelling of the wrist and hand, consistent with cellulitis. No abscess or osteomyelitis. 2. Mild second and third extensor compartment tenosynovitis. Electronically Signed   By: Obie Dredge M.D.   On: 03/14/2023 12:53   DG Wrist Complete Left  Result Date: 03/14/2023 CLINICAL DATA:  Laceration to the left wrist EXAM: LEFT WRIST - COMPLETE 4 VIEW COMPARISON:  Left wrist radiographs dated 11/21/2015 FINDINGS: There is no evidence of fracture or dislocation. There is no evidence of arthropathy or other focal bone abnormality. Soft tissue swelling with foci of subcutaneous emphysema along the dorsal and radial aspect  of the wrist and distal forearm. No radiopaque foreign body. IMPRESSION: 1. Soft tissue swelling with foci of subcutaneous emphysema along the dorsal and radial aspect of the wrist and distal forearm. No radiopaque foreign body. 2. No acute fracture or dislocation. Electronically Signed   By: Agustin Cree M.D.   On: 03/14/2023 08:50    Pending Labs [employer] (From admission, onward)     Start     Ordered   Signed and Held  HIV Antibody (routine testing w rflx)  (HIV Antibody (Routine testing w reflex) panel)  Once,   R        Signed and Held   Signed and Held  CBC  Tomorrow morning,   R        Signed and Held   Signed and Held  Basic metabolic panel  Tomorrow morning,   R        Signed and Held            Vitals/Pain Today's Vitals   03/14/23 1230 03/14/23 1300 03/14/23 1330 03/14/23 1411  BP: (!) 132/90 131/81 112/85   Pulse: 71 (!) 56 (!) 54   Resp:   16   Temp:    97.9 F (36.6 C)  TempSrc:    Oral  SpO2: 100% 100% 100%   Weight:      Height:        Isolation Precautions No active isolations  Medications Medications  Ampicillin-Sulbactam (UNASYN) 3 g in sodium chloride 0.9 % 100 mL IVPB (has no administration in time range)  Tdap  (BOOSTRIX) injection 0.5 mL (0.5 mLs Intramuscular Given 03/14/23 0843)  amoxicillin-clavulanate (AUGMENTIN) 875-125 MG per tablet 1 tablet (1 tablet Oral Given 03/14/23 0843)  Ampicillin-Sulbactam (UNASYN) 3 g in sodium chloride 0.9 % 100 mL IVPB (0 g Intravenous Stopped 03/14/23 1356)  gadobutrol (GADAVIST) 1 MMOL/ML injection 7.5 mL (7.5 mLs Intravenous Contrast Given 03/14/23 1227)    Mobility walks     Focused Assessments     R Recommendations: See Admitting Provider Note  Report given to:   Additional Notes:

## 2023-03-14 NOTE — ED Notes (Signed)
Pt ambulated to restroom. 

## 2023-03-14 NOTE — ED Provider Notes (Signed)
[place] EMERGENCY DEPARTMENT AT [facility] Provider Note   CSN: [id number] Arrival date & time: 03/14/23  8756     History  No chief complaint on file.   Bryce Henderson is a 27 y.o. male.  The history is provided by the patient and medical records. No language interpreter was used.     27 year old male presenting for evaluation of wrist injury.  Patient report 4 days ago he was playing with his dog when his dog accidentally bit him on his left nondominant wrist.  He suffered several lacerations there and he has been trying to keeping it clean and care for it at home with minimal relief.  He endorsed increasing pain and swelling to the affected area.  He is unsure of his last tetanus status.  He said his dog is up-to-date with rabies shots.  He does not endorse any fever or chills and no numbness.  Patient also denies intentional self-harm  Home Medications Prior to Admission medications   Medication Sig Start Date End Date Taking? Authorizing Provider  aspirin-acetaminophen-caffeine (EXCEDRIN MIGRAINE) [phone] MG tablet Take 1 tablet by mouth every 6 (six) hours as needed for headache.    [provider]  methocarbamol (ROBAXIN) 500 MG tablet Take 1 tablet (500 mg total) by mouth 2 (two) times daily. 11/21/15   Palumbo, April, MD  oxyCODONE-acetaminophen (PERCOCET) 5-325 MG tablet Take 1 tablet by mouth every 6 (six) hours as needed. 11/21/15   Palumbo, April, MD      Allergies    Patient has no known allergies.    Review of Systems   Review of Systems  All other systems reviewed and are negative.   Physical Exam Updated Vital Signs BP 124/73 (BP Location: Right Arm)   Pulse 82   Temp 98.3 F (36.8 C) (Oral)   Resp 18   Ht 6' (1.829 m)   Wt 75.8 kg   SpO2 99%   BMI 22.65 kg/m  Physical Exam Vitals and nursing note reviewed.  Constitutional:      General: He is not in acute distress.    Appearance: He is well-developed.  HENT:     Head:  Atraumatic.  Eyes:     Conjunctiva/sclera: Conjunctivae normal.  Musculoskeletal:        General: Swelling (Left upper extremity: Multiple bite wounds noted to the radial aspect of the wrist dorsally with a large wound as noted in pictures below.  Radial pulse 2+ able to make a fist and able to flex and extend fingers) present.     Cervical back: Neck supple.     Comments: Dependent edema noted to dorsum of left hand  Skin:    Findings: No rash.  Neurological:     Mental Status: He is alert.            ED Results / Procedures / Treatments   Labs (all labs ordered are listed, but only abnormal results are displayed) Labs Reviewed  CBC WITH DIFFERENTIAL/PLATELET - Abnormal; Notable for the following components:      Result Value   RBC 4.07 (*)    Hemoglobin 12.0 (*)    HCT 35.9 (*)    All other components within normal limits  BASIC METABOLIC PANEL - Abnormal; Notable for the following components:   Calcium 8.8 (*)    All other components within normal limits  I-STAT CG4 LACTIC ACID, ED    EKG None  Radiology MR FOREARM LEFT W WO CONTRAST  Result  Date: 03/14/2023 CLINICAL DATA:  Bitten by his dog on Monday. EXAM: MRI OF THE LEFT FOREARM WITHOUT AND WITH CONTRAST TECHNIQUE: Multiplanar, multisequence MR imaging of the left forearm was performed before and after the administration of intravenous contrast. CONTRAST:  7.78mL GADAVIST GADOBUTROL 1 MMOL/ML IV SOLN COMPARISON:  None Available. FINDINGS: Bones/Joint/Cartilage No marrow signal abnormality. No fracture or dislocation. Joint spaces are preserved. No joint effusion. Muscles and Tendons Flexor and extensor tendons are intact. Small amount of fluid in the second and third extensor compartment tendon sheaths with associated synovial enhancement. No muscle edema or atrophy. Soft tissue Severe diffuse soft tissue swelling with enhancement. No discrete fluid collection. Soft tissue irregularity along the dorsal and radial  aspects of the distal radius, presumably puncture wounds from dog bite. Mild inflammatory change in the pronator fat pad. No soft tissue mass. IMPRESSION: 1. Severe cellulitis of the forearm. No abscess or osteomyelitis. 2. Mild second and third extensor compartment tenosynovitis. 3. Soft tissue irregularity along the dorsal and radial aspects of the distal radius, presumably puncture wounds from dog bite. Electronically Signed   By: Obie Dredge M.D.   On: 03/14/2023 13:01   MR HAND LEFT W WO CONTRAST  Result Date: 03/14/2023 CLINICAL DATA:  Dog bite on Monday. EXAM: MRI OF THE LEFT HAND WITHOUT AND WITH CONTRAST TECHNIQUE: Multiplanar, multisequence MR imaging of the left hand was performed before and after the administration of intravenous contrast. CONTRAST:  7.4mL GADAVIST GADOBUTROL 1 MMOL/ML IV SOLN COMPARISON:  Left wrist x-rays from same day. FINDINGS: Bones/Joint/Cartilage No marrow signal abnormality. No fracture or dislocation. Joint spaces are preserved. No joint effusion. Ligaments Collateral ligaments are intact. Muscles and Tendons Flexor and extensor tendons are intact. Small amount of fluid in the second and third extensor tendon compartments with associated synovial enhancement. Soft tissue Severe soft tissue swelling of the wrist and hand with patchy enhancement. No fluid collection. Soft tissue irregularity at the level of the dorsal distal radius, presumably site of dog bite. No soft tissue mass. IMPRESSION: 1. Severe soft tissue swelling of the wrist and hand, consistent with cellulitis. No abscess or osteomyelitis. 2. Mild second and third extensor compartment tenosynovitis. Electronically Signed   By: Obie Dredge M.D.   On: 03/14/2023 12:53   DG Wrist Complete Left  Result Date: 03/14/2023 CLINICAL DATA:  Laceration to the left wrist EXAM: LEFT WRIST - COMPLETE 4 VIEW COMPARISON:  Left wrist radiographs dated 11/21/2015 FINDINGS: There is no evidence of fracture or dislocation.  There is no evidence of arthropathy or other focal bone abnormality. Soft tissue swelling with foci of subcutaneous emphysema along the dorsal and radial aspect of the wrist and distal forearm. No radiopaque foreign body. IMPRESSION: 1. Soft tissue swelling with foci of subcutaneous emphysema along the dorsal and radial aspect of the wrist and distal forearm. No radiopaque foreign body. 2. No acute fracture or dislocation. Electronically Signed   By: Agustin Cree M.D.   On: 03/14/2023 08:50    Procedures Procedures    Medications Ordered in ED Medications  Ampicillin-Sulbactam (UNASYN) 3 g in sodium chloride 0.9 % 100 mL IVPB (3 g Intravenous New Bag/Given 03/14/23 1316)  Ampicillin-Sulbactam (UNASYN) 3 g in sodium chloride 0.9 % 100 mL IVPB (has no administration in time range)  Tdap (BOOSTRIX) injection 0.5 mL (0.5 mLs Intramuscular Given 03/14/23 0843)  amoxicillin-clavulanate (AUGMENTIN) 875-125 MG per tablet 1 tablet (1 tablet Oral Given 03/14/23 0843)  gadobutrol (GADAVIST) 1 MMOL/ML injection 7.5 mL (7.5 mLs  Intravenous Contrast Given 03/14/23 1227)    ED Course/ Medical Decision Making/ A&P Clinical Course as of 03/14/23 1339  Thu Mar 14, 2023  1610 This is a 27 year old male presenting to the ED with complaint of a dog bite to the left forearm approximately 4 days ago.  He reports that this is domesticated dog's vaccines are up-to-date.  He is predominantly right-handed.  He reports progressive worsening of swelling and pain in his left forearm.  No reported fevers and chills.  Patient is afebrile on arrival.  He does have a macerated and mildly gaping injury to the left dorsal forearm, with a small amount of purulence.  X-rays do not show retained foreign body but do note a small amount of subcutaneous free air.  The patient is able to flex and extend all the digits in his hand and does not have any numbness of the hand.  Differential at this point would include localized infection versus an  extensor tenosynovitis.  Patient is pending blood work and will be evaluated by a staff member of the hand surgery team.  I do not appreciate any drainable abscess on exam.  He was ordered Augmentin as well as tetanus. [MT]  1003 WBC: 8.4 [MT]  1003 Lactic Acid, Venous: 0.5 [MT]  1251 Per Earney Hamburg PA plan for OR with hand surgeon for washout today [MT]    Clinical Course User Index [MT] Trifan, Kermit Balo, MD                                 Medical Decision Making Amount and/or Complexity of Data Reviewed Labs: ordered. Decision-making details documented in ED Course. Radiology: ordered.  Risk Prescription drug management.   BP 124/73 (BP Location: Right Arm)   Pulse 82   Temp 98.3 F (36.8 C) (Oral)   Resp 18   Ht 6' (1.829 m)   Wt 75.8 kg   SpO2 99%   BMI 22.65 kg/m   35:99 AM 27 year old male presenting for evaluation of wrist injury.  Patient report 4 days ago he was playing with his dog when his dog accidentally bit him on his left nondominant wrist.  He suffered several lacerations there and he has been trying to keeping it clean and care for it at home with minimal relief.  He endorsed increasing pain and swelling to the affected area.  He is unsure of his last tetanus status.  He said his dog is up-to-date with rabies shots.  He does not endorse any fever or chills and no numbness.  On exam, patient is laying in bed in no acute discomfort.  Left upper extremity remarkable for bite wound noted to the dorsum of the wrist with dependent edema involving the left hand.  No obvious retained body noted.  He is neurovascular intact.  He is able to make a fist with some difficulty.  He is able to extend and flex all of his fingers  -Labs ordered, independently viewed and interpreted by me.  Labs remarkable for normal lactic acid, normal WBC, electrolytes are reassuring -The patient was maintained on a cardiac monitor.  I personally viewed and interpreted the cardiac monitored  which showed an underlying rhythm of: sinus brady -Imaging independently viewed and interpreted by me and I agree with radiologist's interpretation.  Result remarkable for MRI of L hand and forearm showing severe soft tissue swelling of wrist/hand consistent with cellulitis and evidence of second  and third extensor compartment tenosynovitis -This patient presents to the ED for concern of dog bite, this involves an extensive number of treatment options, and is a complaint that carries with it a high risk of complications and morbidity.  The differential diagnosis includes infectected dog bite, cellulitis, abscess, tenosynovitis, fx -Co morbidities that complicate the patient evaluation includes none -Treatment includes IV abx, opiate pain medication, IVF -Reevaluation of the patient after these medicines showed that the patient improved -PCP office notes or outside notes reviewed -Discussion with specialist orthopedist PA Earney Hamburg who agrees to see pt in the ER and will give recommendation.  -Escalation to admission/observation considered: patient is agreeable with admittion  12:18 PM Kuzma plan to take pt to the OR for wash out at 5pm, request medicine admission and starting iv abx.   1:42 PM Appreciate consultation from [facility] hospitalist, Dr. Katrinka Blazing who agrees to admit patient for IV antibiotic and close monitoring.         Final Clinical Impression(s) / ED Diagnoses Final diagnoses:  Infectious tenosynovitis of left wrist extensor  Dog bite, initial encounter  Wound infection    Rx / DC Orders ED Discharge Orders     None         Fayrene Helper, PA-C 03/14/23 1345    Terald Sleeper, MD 03/14/23 [phone]

## 2023-03-15 ENCOUNTER — Encounter (HOSPITAL_COMMUNITY): Payer: Self-pay | Admitting: Orthopedic Surgery

## 2023-03-15 MED ORDER — AMOXICILLIN-POT CLAVULANATE 875-125 MG PO TABS: 1.0000 | ORAL_TABLET | Freq: Two times a day (BID) | ORAL | 0 refills | Status: AC

## 2023-03-15 MED ORDER — HYDROCODONE-ACETAMINOPHEN 5-325 MG PO TABS: 1.0000 | ORAL_TABLET | Freq: Four times a day (QID) | ORAL | 0 refills | Status: AC | PRN

## 2023-03-15 MED ORDER — ASCORBIC ACID 1000 MG PO TABS: 1000.0000 mg | ORAL_TABLET | Freq: Every day | ORAL | 0 refills | Status: AC

## 2023-03-15 NOTE — Discharge Summary (Signed)
Physician Discharge Summary  [name] BMW:[id number] DOB: [birth year] DOA: 03/14/2023  PCP: Maryellen Pile, MD (Inactive)  Admit date: 03/14/2023 Discharge date: 03/15/2023  Admitted From: Home Disposition: Home  Recommendations for Outpatient Follow-up:  Follow up with PCP in 1-2 weeks Follow-up with orthopedics for wound care and hydrotherapy as discussed  Home Health: None Equipment/Devices: None  Discharge Condition: Stable CODE STATUS: Full Diet recommendation: Low-salt low-fat diet  Brief/Interim Summary: Bryce Henderson is a 27 y.o. male without significant medical history who presents with complaints of swollen left wrist.  He had gotten bit by his dog on his wrist 3 days ago. Hospitalist called for admission, orthopedics called in consult.   Patient tolerated debridement/I&D 03/14/2023.  Patient has no notable leukocytosis or fevers.  Will transition patient from Unasyn to Augmentin for discharge, continue 2 weeks of antibiotics, patient prescribed analgesics as below, if these are inadequate or course needs to be stented we would defer to orthopedic surgery at the next outpatient visit.  Patient otherwise feels quite well and is agreeable and stable for discharge home.  Discharge Diagnoses:  Principal Problem:   Infected dog bite of forearm, initial encounter Active Problems:   Tenosynovitis of forearm   Cellulitis of left forearm   Normocytic anemia   Discharge Instructions   Allergies as of 03/15/2023   No Known Allergies      Medication List     TAKE these medications    acetaminophen 500 MG tablet Commonly known as: TYLENOL Take 1,000 mg by mouth 2 (two) times daily as needed for moderate pain, fever or headache.   amoxicillin-clavulanate 875-125 MG tablet Commonly known as: AUGMENTIN Take 1 tablet by mouth 2 (two) times daily for 14 days.   ascorbic acid 1000 MG tablet Commonly known as: VITAMIN C Take 1 tablet (1,000 mg total) by mouth daily. Start  taking on: March 16, 2023   HYDROcodone-acetaminophen 5-325 MG tablet Commonly known as: NORCO/VICODIN Take 1 tablet by mouth every 6 (six) hours as needed for moderate pain.   ibuprofen 200 MG tablet Commonly known as: ADVIL Take 800 mg by mouth 2 (two) times daily as needed for headache or moderate pain.        Follow-up Information     Betha Loa, MD. Call.   Specialty: Orthopedic Surgery Contact information: [address] [place] [address] [phone]                No Known Allergies  Consultations: Orthopedic surgery  Procedures/Studies: MR FOREARM LEFT W WO CONTRAST  Result Date: 03/14/2023 CLINICAL DATA:  Bitten by his dog on Monday. EXAM: MRI OF THE LEFT FOREARM WITHOUT AND WITH CONTRAST TECHNIQUE: Multiplanar, multisequence MR imaging of the left forearm was performed before and after the administration of intravenous contrast. CONTRAST:  7.27mL GADAVIST GADOBUTROL 1 MMOL/ML IV SOLN COMPARISON:  None Available. FINDINGS: Bones/Joint/Cartilage No marrow signal abnormality. No fracture or dislocation. Joint spaces are preserved. No joint effusion. Muscles and Tendons Flexor and extensor tendons are intact. Small amount of fluid in the second and third extensor compartment tendon sheaths with associated synovial enhancement. No muscle edema or atrophy. Soft tissue Severe diffuse soft tissue swelling with enhancement. No discrete fluid collection. Soft tissue irregularity along the dorsal and radial aspects of the distal radius, presumably puncture wounds from dog bite. Mild inflammatory change in the pronator fat pad. No soft tissue mass. IMPRESSION: 1. Severe cellulitis of the forearm. No abscess or osteomyelitis. 2. Mild second and third extensor  compartment tenosynovitis. 3. Soft tissue irregularity along the dorsal and radial aspects of the distal radius, presumably puncture wounds from dog bite. Electronically Signed   By: Obie Dredge M.D.   On:  03/14/2023 13:01   MR HAND LEFT W WO CONTRAST  Result Date: 03/14/2023 CLINICAL DATA:  Dog bite on Monday. EXAM: MRI OF THE LEFT HAND WITHOUT AND WITH CONTRAST TECHNIQUE: Multiplanar, multisequence MR imaging of the left hand was performed before and after the administration of intravenous contrast. CONTRAST:  7.54mL GADAVIST GADOBUTROL 1 MMOL/ML IV SOLN COMPARISON:  Left wrist x-rays from same day. FINDINGS: Bones/Joint/Cartilage No marrow signal abnormality. No fracture or dislocation. Joint spaces are preserved. No joint effusion. Ligaments Collateral ligaments are intact. Muscles and Tendons Flexor and extensor tendons are intact. Small amount of fluid in the second and third extensor tendon compartments with associated synovial enhancement. Soft tissue Severe soft tissue swelling of the wrist and hand with patchy enhancement. No fluid collection. Soft tissue irregularity at the level of the dorsal distal radius, presumably site of dog bite. No soft tissue mass. IMPRESSION: 1. Severe soft tissue swelling of the wrist and hand, consistent with cellulitis. No abscess or osteomyelitis. 2. Mild second and third extensor compartment tenosynovitis. Electronically Signed   By: Obie Dredge M.D.   On: 03/14/2023 12:53   DG Wrist Complete Left  Result Date: 03/14/2023 CLINICAL DATA:  Laceration to the left wrist EXAM: LEFT WRIST - COMPLETE 4 VIEW COMPARISON:  Left wrist radiographs dated 11/21/2015 FINDINGS: There is no evidence of fracture or dislocation. There is no evidence of arthropathy or other focal bone abnormality. Soft tissue swelling with foci of subcutaneous emphysema along the dorsal and radial aspect of the wrist and distal forearm. No radiopaque foreign body. IMPRESSION: 1. Soft tissue swelling with foci of subcutaneous emphysema along the dorsal and radial aspect of the wrist and distal forearm. No radiopaque foreign body. 2. No acute fracture or dislocation. Electronically Signed   By: Agustin Cree  M.D.   On: 03/14/2023 08:50     Subjective: No acute issues or events overnight, pain well-controlled denies nausea vomiting diarrhea constipation headache fevers chills or chest pains   Discharge Exam: Vitals:   03/15/23 0445 03/15/23 0724  BP: 137/84 125/73  Pulse: (!) 52 (!) 57  Resp:  16  Temp: 97.8 F (36.6 C) 98.4 F (36.9 C)  SpO2: 100% 99%   Vitals:   03/14/23 2130 03/14/23 2142 03/15/23 0445 03/15/23 0724  BP: 120/80 129/86 137/84 125/73  Pulse: (!) 54 (!) 53 (!) 52 (!) 57  Resp: (!) 21   16  Temp: 97.8 F (36.6 C) 97.6 F (36.4 C) 97.8 F (36.6 C) 98.4 F (36.9 C)  TempSrc:  Oral Oral Oral  SpO2: 94% 98% 100% 99%  Weight:      Height:        General: Pt is alert, awake, not in acute distress Cardiovascular: RRR, S1/S2 +, no rubs, no gallops Respiratory: CTA bilaterally, no wheezing, no rhonchi Abdominal: Soft, NT, ND, bowel sounds + Extremities: Left upper extremity bandage clean dry intact, intact EXTR and sensation distal to surgical site    The results of significant diagnostics from this hospitalization (including imaging, microbiology, ancillary and laboratory) are listed below for reference.     Microbiology: Recent Results (from the past 240 hour(s))  Aerobic/Anaerobic Culture w Gram Stain (surgical/deep wound)     Status: None (Preliminary result)   Collection Time: 03/14/23  7:40 PM  Specimen: Wound  Result Value Ref Range Status   Specimen Description WOUND LEFT FOREARM  Final   Special Requests DOG BITE  Final   Gram Stain   Final    NO WBC SEEN FEW GRAM NEGATIVE RODS RARE GRAM POSITIVE COCCI    Culture   Final    TOO YOUNG TO READ Performed at [facility], [address] [place], [place] [address]    Report Status PENDING  Incomplete     Labs:  Basic Metabolic Panel: Recent Labs  Lab 03/14/23 0929 03/15/23 0005  NA 137 136  K 3.8 4.1  CL 104 101  CO2 28 23  GLUCOSE 89 120*  BUN 8 13  CREATININE 1.15 1.12   CALCIUM 8.8* 9.0   CBC: Recent Labs  Lab 03/14/23 0929 03/15/23 0005  WBC 8.4 9.2  NEUTROABS 5.7  --   HGB 12.0* 13.1  HCT 35.9* 39.5  MCV 88.2 89.4  PLT 162 172   Sepsis Labs Recent Labs  Lab 03/14/23 0929 03/15/23 0005  WBC 8.4 9.2   Microbiology Recent Results (from the past 240 hour(s))  Aerobic/Anaerobic Culture w Gram Stain (surgical/deep wound)     Status: None (Preliminary result)   Collection Time: 03/14/23  7:40 PM   Specimen: Wound  Result Value Ref Range Status   Specimen Description WOUND LEFT FOREARM  Final   Special Requests DOG BITE  Final   Gram Stain   Final    NO WBC SEEN FEW GRAM NEGATIVE RODS RARE GRAM POSITIVE COCCI    Culture   Final    TOO YOUNG TO READ Performed at [facility], [address], [place], [place] [address]    Report Status PENDING  Incomplete     Time coordinating discharge: Over 30 minutes  SIGNED:   Azucena Fallen, DO [facility] 03/15/2023, 12:04 PM Pager   If 7PM-7AM, please contact night-coverage [email or web address]

## 2023-03-15 NOTE — Plan of Care (Signed)

## 2023-03-15 NOTE — Plan of Care (Signed)
Problem: Education: Goal: Knowledge of General Education information will improve Description: Including pain rating scale, medication(s)/side effects and non-pharmacologic comfort measures 03/15/2023 1226 by Letta Moynahan, RN Outcome: Adequate for Discharge 03/15/2023 0954 by Letta Moynahan, RN Outcome: Progressing 03/15/2023 0953 by Letta Moynahan, RN Outcome: Progressing   Problem: Health Behavior/Discharge Planning: Goal: Ability to manage health-related needs will improve 03/15/2023 1226 by Letta Moynahan, RN Outcome: Adequate for Discharge 03/15/2023 0954 by Letta Moynahan, RN Outcome: Progressing 03/15/2023 0953 by Letta Moynahan, RN Outcome: Progressing   Problem: Clinical Measurements: Goal: Ability to maintain clinical measurements within normal limits will improve 03/15/2023 1226 by Letta Moynahan, RN Outcome: Adequate for Discharge 03/15/2023 0954 by Letta Moynahan, RN Outcome: Progressing 03/15/2023 0953 by Letta Moynahan, RN Outcome: Progressing Goal: Will remain free from infection 03/15/2023 1226 by Letta Moynahan, RN Outcome: Adequate for Discharge 03/15/2023 0954 by Letta Moynahan, RN Outcome: Progressing 03/15/2023 0953 by Letta Moynahan, RN Outcome: Progressing Goal: Diagnostic test results will improve 03/15/2023 1226 by Letta Moynahan, RN Outcome: Adequate for Discharge 03/15/2023 0954 by Letta Moynahan, RN Outcome: Progressing 03/15/2023 0953 by Letta Moynahan, RN Outcome: Progressing Goal: Respiratory complications will improve 03/15/2023 1226 by Letta Moynahan, RN Outcome: Adequate for Discharge 03/15/2023 0954 by Letta Moynahan, RN Outcome: Progressing 03/15/2023 0953 by Letta Moynahan, RN Outcome: Progressing Goal: Cardiovascular complication will be avoided 03/15/2023 1226 by Letta Moynahan, RN Outcome: Adequate for Discharge 03/15/2023 0954 by Letta Moynahan, RN Outcome: Progressing 03/15/2023 0953 by Letta Moynahan, RN Outcome: Progressing   Problem: Activity: Goal:  Risk for activity intolerance will decrease 03/15/2023 1226 by Letta Moynahan, RN Outcome: Adequate for Discharge 03/15/2023 0954 by Letta Moynahan, RN Outcome: Progressing 03/15/2023 0953 by Letta Moynahan, RN Outcome: Progressing   Problem: Nutrition: Goal: Adequate nutrition will be maintained 03/15/2023 1226 by Letta Moynahan, RN Outcome: Adequate for Discharge 03/15/2023 0954 by Letta Moynahan, RN Outcome: Progressing 03/15/2023 0953 by Letta Moynahan, RN Outcome: Progressing   Problem: Coping: Goal: Level of anxiety will decrease 03/15/2023 1226 by Letta Moynahan, RN Outcome: Adequate for Discharge 03/15/2023 0954 by Letta Moynahan, RN Outcome: Progressing 03/15/2023 0953 by Letta Moynahan, RN Outcome: Progressing   Problem: Elimination: Goal: Will not experience complications related to bowel motility 03/15/2023 1226 by Letta Moynahan, RN Outcome: Adequate for Discharge 03/15/2023 0954 by Letta Moynahan, RN Outcome: Progressing 03/15/2023 0953 by Letta Moynahan, RN Outcome: Progressing Goal: Will not experience complications related to urinary retention 03/15/2023 1226 by Letta Moynahan, RN Outcome: Adequate for Discharge 03/15/2023 0954 by Letta Moynahan, RN Outcome: Progressing 03/15/2023 0953 by Letta Moynahan, RN Outcome: Progressing   Problem: Pain Managment: Goal: General experience of comfort will improve 03/15/2023 1226 by Letta Moynahan, RN Outcome: Adequate for Discharge 03/15/2023 0954 by Letta Moynahan, RN Outcome: Progressing 03/15/2023 0953 by Letta Moynahan, RN Outcome: Progressing   Problem: Safety: Goal: Ability to remain free from injury will improve 03/15/2023 1226 by Letta Moynahan, RN Outcome: Adequate for Discharge 03/15/2023 0954 by Letta Moynahan, RN Outcome: Progressing 03/15/2023 0953 by Letta Moynahan, RN Outcome: Progressing   Problem: Skin Integrity: Goal: Risk for impaired skin integrity will decrease 03/15/2023 1226 by Letta Moynahan, RN Outcome: Adequate for  Discharge 03/15/2023 0954 by Letta Moynahan, RN Outcome: Progressing 03/15/2023 0953 by Letta Moynahan, RN  Outcome: Progressing

## 2023-03-15 NOTE — Plan of Care (Signed)
  Problem: Education: Goal: Knowledge of General Education information will improve Description: Including pain rating scale, medication(s)/side effects and non-pharmacologic comfort measures 03/15/2023 0954 by Letta Moynahan, RN Outcome: Progressing 03/15/2023 0953 by Letta Moynahan, RN Outcome: Progressing   Problem: Health Behavior/Discharge Planning: Goal: Ability to manage health-related needs will improve 03/15/2023 0954 by Letta Moynahan, RN Outcome: Progressing 03/15/2023 0953 by Letta Moynahan, RN Outcome: Progressing   Problem: Clinical Measurements: Goal: Ability to maintain clinical measurements within normal limits will improve 03/15/2023 0954 by Letta Moynahan, RN Outcome: Progressing 03/15/2023 0953 by Letta Moynahan, RN Outcome: Progressing Goal: Will remain free from infection 03/15/2023 0954 by Letta Moynahan, RN Outcome: Progressing 03/15/2023 0953 by Letta Moynahan, RN Outcome: Progressing Goal: Diagnostic test results will improve 03/15/2023 0954 by Letta Moynahan, RN Outcome: Progressing 03/15/2023 0953 by Letta Moynahan, RN Outcome: Progressing Goal: Respiratory complications will improve 03/15/2023 0954 by Letta Moynahan, RN Outcome: Progressing 03/15/2023 0953 by Letta Moynahan, RN Outcome: Progressing Goal: Cardiovascular complication will be avoided 03/15/2023 0954 by Letta Moynahan, RN Outcome: Progressing 03/15/2023 0953 by Letta Moynahan, RN Outcome: Progressing   Problem: Activity: Goal: Risk for activity intolerance will decrease 03/15/2023 0954 by Letta Moynahan, RN Outcome: Progressing 03/15/2023 0953 by Letta Moynahan, RN Outcome: Progressing   Problem: Nutrition: Goal: Adequate nutrition will be maintained 03/15/2023 0954 by Letta Moynahan, RN Outcome: Progressing 03/15/2023 0953 by Letta Moynahan, RN Outcome: Progressing   Problem: Coping: Goal: Level of anxiety will decrease 03/15/2023 0954 by Letta Moynahan, RN Outcome: Progressing 03/15/2023 0953 by Letta Moynahan, RN Outcome: Progressing   Problem: Elimination: Goal: Will not experience complications related to bowel motility 03/15/2023 0954 by Letta Moynahan, RN Outcome: Progressing 03/15/2023 0953 by Letta Moynahan, RN Outcome: Progressing Goal: Will not experience complications related to urinary retention 03/15/2023 0954 by Letta Moynahan, RN Outcome: Progressing 03/15/2023 0953 by Letta Moynahan, RN Outcome: Progressing   Problem: Pain Managment: Goal: General experience of comfort will improve 03/15/2023 0954 by Letta Moynahan, RN Outcome: Progressing 03/15/2023 0953 by Letta Moynahan, RN Outcome: Progressing   Problem: Safety: Goal: Ability to remain free from injury will improve 03/15/2023 0954 by Letta Moynahan, RN Outcome: Progressing 03/15/2023 0953 by Letta Moynahan, RN Outcome: Progressing   Problem: Skin Integrity: Goal: Risk for impaired skin integrity will decrease 03/15/2023 0954 by Letta Moynahan, RN Outcome: Progressing 03/15/2023 0953 by Letta Moynahan, RN Outcome: Progressing

## 2023-03-15 NOTE — TOC CAGE-AID Note (Signed)
Transition of Care [facility]) - CAGE-AID Screening   Patient Details  Name: Bryce Henderson MRN: [id number] Date of Birth: [birth year]   Hewitt Shorts, RN Trauma Response Nurse Phone Number: [phone] 03/15/2023, 12:33 PM   Clinical Narrative:  Pt here with infected hand, had surgery with wash out yesterday (03/14/23)   CAGE-AID Screening:    Have You Ever Felt You Ought to Cut Down on Your Drinking or Drug Use?: No Have People Annoyed You By [employer] Your Drinking Or Drug Use?: No Have You Felt Bad Or Guilty About Your Drinking Or Drug Use?: No Have You Ever Had a Drink or Used Drugs First Thing In The Morning to Steady Your Nerves or to Get Rid of a Hangover?: No CAGE-AID Score: 0  Substance Abuse Education Offered: No (drinks alcohol socially)

## 2023-03-15 NOTE — Progress Notes (Signed)
Orthopedic Tech Progress Note Patient Details:  Bryce Henderson [birth year] [id number]  Ortho Devices Type of Ortho Device: Sling immobilizer Ortho Device/Splint Location: LUE Ortho Device/Splint Interventions: Ordered, Application, Adjustment   Post Interventions Patient Tolerated: Well Instructions Provided: Care of device, Adjustment of device  [place] A Bryce Henderson 03/15/2023, 1:35 PM

## 2023-03-15 NOTE — Anesthesia Postprocedure Evaluation (Signed)
Anesthesia Post Note  Patient: BRITTON HEYSER  Procedure(s) Performed: IRRIGATION AND DEBRIDEMENT LEFT UPPER ARM (Left)     Patient location during evaluation: PACU Anesthesia Type: General Level of consciousness: awake and alert Pain management: pain level controlled Vital Signs Assessment: post-procedure vital signs reviewed and stable Respiratory status: spontaneous breathing, nonlabored ventilation and respiratory function stable Cardiovascular status: blood pressure returned to baseline and stable Postop Assessment: no apparent nausea or vomiting Anesthetic complications: no   No notable events documented.  Last Vitals:  Vitals:   03/14/23 2130 03/14/23 2142  BP: 120/80 129/86  Pulse: (!) 54 (!) 53  Resp: (!) 21   Temp: 36.6 C 36.4 C  SpO2: 94% 98%    Last Pain:  Vitals:   03/14/23 2226  TempSrc:   PainSc: Asleep                 Chameka Mcmullen
